# Patient Record
Sex: Female | Born: 1995 | Hispanic: Yes | Marital: Single | Smoking: Former smoker
Health system: Southern US, Community
[De-identification: ages and names within clinical notes are randomized; demographics above are authoritative.]

## PROBLEM LIST (undated history)

## (undated) DIAGNOSIS — F32A Depression, unspecified: Secondary | ICD-10-CM

---

## 2021-10-12 ENCOUNTER — Emergency Department (INDEPENDENT_AMBULATORY_CARE_PROVIDER_SITE_OTHER): Payer: 59

## 2021-10-12 ENCOUNTER — Other Ambulatory Visit: Payer: Self-pay

## 2021-10-12 ENCOUNTER — Emergency Department (INDEPENDENT_AMBULATORY_CARE_PROVIDER_SITE_OTHER)
Admission: EM | Admit: 2021-10-12 | Discharge: 2021-10-12 | Disposition: A | Discharge status: Home / Self Care | Payer: 59 | Source: Home / Self Care

## 2021-10-12 DIAGNOSIS — R109 Unspecified abdominal pain: Secondary | ICD-10-CM

## 2021-10-12 DIAGNOSIS — R1030 Lower abdominal pain, unspecified: Secondary | ICD-10-CM | POA: Diagnosis not present

## 2021-10-12 DIAGNOSIS — R110 Nausea: Secondary | ICD-10-CM

## 2021-10-12 HISTORY — DX: Depression, unspecified: F32.A

## 2021-10-12 MED ORDER — ONDANSETRON 8 MG PO TBDP: 8.0000 mg | ORAL_TABLET | Freq: Three times a day (TID) | ORAL | 0 refills | Status: AC | PRN

## 2021-10-12 NOTE — Discharge Instructions (Addendum)
Advised/inform patient today's imaging (flatplate of abdomen) was in normal limits without acute process.  Advised may use Zofran for nausea daily or as needed.  Advised patient if symptoms worsen and/or unresolved please follow-up with PCP or here for further evaluation (serological tests and scan of abdomen).

## 2021-10-12 NOTE — ED Triage Notes (Addendum)
Pt presents to Urgent Care with c/o lower abdominal pain x approx 1 week, especially when having bowel movements. Reports initially having diarrhea, but this has subsided. States she has had black stools x approx one week--has been taking Pepto-Bismol. Also reports having episodes of dizziness x 3 days.

## 2021-10-12 NOTE — ED Provider Notes (Signed)
[facility]    CSN: [id number] Arrival date & time: 10/12/21  1035      History   Chief Complaint Chief Complaint  Patient presents with   Abdominal Pain   Dizziness    HPI Chey[facility] Velazquez is a 26 y.o. female.   HPI 26 year old female presents with lower abdominal pain for 1 week especially when having bowel movements.  Reports was initially diarrhea in a house that subsided.  Reports black-colored stools for 1 week.  Past Medical History:  Diagnosis Date   Depression     There are no problems to display for this patient.   History reviewed. No pertinent surgical history.  OB History   No obstetric history on file.      Home Medications    Prior to Admission medications   Medication Sig Start Date End Date Taking? Authorizing Provider  bismuth subsalicylate (PEPTO BISMOL) 262 MG/15ML suspension Take 30 mLs by mouth every 6 (six) hours as needed.   Yes [provider]  ondansetron (ZOFRAN-ODT) 8 MG disintegrating tablet Take 1 tablet (8 mg total) by mouth every 8 (eight) hours as needed for nausea or vomiting. 10/12/21  Yes Eliezer Lofts, FNP    Family History Family History  Problem Relation Age of Onset   Hypertension Mother    Alzheimer's disease Father    Kidney failure Father    GI Bleed Father     Social History Social History   Tobacco Use   Smoking status: Former    Types: Cigarettes   Smokeless tobacco: Never  Vaping Use   Vaping Use: Former  Substance Use Topics   Alcohol use: Yes    Comment: occasionally   Drug use: Not Currently     Allergies   Patient has no known allergies.   Review of Systems Review of Systems  Gastrointestinal:  Positive for abdominal pain.       Dark-colored stools x1 week  All other systems reviewed and are negative.   Physical Exam Triage Vital Signs ED Triage Vitals  Enc Vitals Group     BP 10/12/21 1114 124/85     Pulse Rate 10/12/21 1114 80     Resp 10/12/21 1114 18      Temp 10/12/21 1114 98.9 F (37.2 C)     Temp Source 10/12/21 1114 Oral     SpO2 10/12/21 1114 97 %     Weight 10/12/21 1107 130 lb (59 kg)     Height 10/12/21 1107 5\' 1"  (1.549 m)     Head Circumference --      Peak Flow --      Pain Score 10/12/21 1107 3     Pain Loc --      Pain Edu? --      Excl. in [place]? --    No data found.  Updated Vital Signs BP 124/85 (BP Location: Right Arm)    Pulse 80    Temp 98.9 F (37.2 C) (Oral)    Resp 18    Ht 5\' 1"  (1.549 m)    Wt 130 lb (59 kg)    LMP 09/27/2021 (Approximate)    SpO2 97%    BMI 24.56 kg/m    Physical Exam Vitals and nursing note reviewed.  Constitutional:      General: She is not in acute distress.    Appearance: She is well-developed and normal weight. She is not ill-appearing.  HENT:     Head: Normocephalic and atraumatic.  Right Ear: Hearing, tympanic membrane, ear canal and external ear normal.     Left Ear: Hearing, tympanic membrane, ear canal and external ear normal.     Mouth/Throat:     Mouth: Mucous membranes are moist.     Pharynx: Oropharynx is clear. Uvula midline. No posterior oropharyngeal erythema or uvula swelling.     Tonsils: 3+ on the right. 3+ on the left.  Eyes:     Extraocular Movements: Extraocular movements intact.     Pupils: Pupils are equal, round, and reactive to light.  Cardiovascular:     Rate and Rhythm: Normal rate and regular rhythm.     Heart sounds: Normal heart sounds. No murmur heard.   No friction rub. No gallop.  Pulmonary:     Effort: Pulmonary effort is normal.     Breath sounds: Normal breath sounds.  Abdominal:     General: Abdomen is flat. Bowel sounds are absent.     Palpations: Abdomen is soft. There is no shifting dullness, fluid wave, hepatomegaly, splenomegaly, mass or pulsatile mass.     Tenderness: There is abdominal tenderness in the right lower quadrant and left lower quadrant. There is no right CVA tenderness, left CVA tenderness, guarding or rebound. Negative  signs include Murphy's sign, Rovsing's sign and McBurney's sign.  Skin:    General: Skin is warm and dry.  Neurological:     Mental Status: She is alert.     UC Treatments / Results  Labs (all labs ordered are listed, but only abnormal results are displayed) Labs Reviewed - No data to display  EKG   Radiology DG Abdomen 1 View  Result Date: 10/12/2021 CLINICAL DATA:  Abdominal pain. EXAM: ABDOMEN - 1 VIEW COMPARISON:  None. FINDINGS: The bowel gas pattern is unremarkable. Scattered air and stool throughout the colon but no significant stool burden. No findings for small bowel obstruction or free air. The soft tissue shadows are maintained. No worrisome calcifications. The bony structures are unremarkable. IMPRESSION: Unremarkable abdominal radiograph. Electronically Signed   By: Marijo Sanes M.D.   On: 10/12/2021 12:18    Procedures Procedures (including critical care time)  Medications Ordered in UC Medications - No data to display  Initial Impression / Assessment and Plan / UC Course  I have reviewed the triage vital signs and the nursing notes.  Pertinent labs & imaging results that were available during my care of the patient were reviewed by me and considered in my medical decision making (see chart for details).     MDM: 1.  Lower abdominal pain-KUB above was unremarkable; 2.  Nausea-Rx'd Zofran. Advised/inform patient today's imaging (flatplate of abdomen) was in normal limits without acute process.  Advised may use Zofran for nausea daily or as needed.  Advised patient if symptoms worsen and/or unresolved please follow-up with PCP or here for further evaluation (serological tests and scan of abdomen).  Patient discharged home, hemodynamically stable. Final Clinical Impressions(s) / UC Diagnoses   Final diagnoses:  Lower abdominal pain  Nausea     Discharge Instructions      Advised/inform patient today's imaging (flatplate of abdomen) was in normal limits without  acute process.  Advised may use Zofran for nausea daily or as needed.  Advised patient if symptoms worsen and/or unresolved please follow-up with PCP or here for further evaluation (serological tests and scan of abdomen).     ED Prescriptions     Medication Sig Dispense Auth. Provider   ondansetron (ZOFRAN-ODT) 8 MG disintegrating  tablet Take 1 tablet (8 mg total) by mouth every 8 (eight) hours as needed for nausea or vomiting. 24 tablet Eliezer Lofts, FNP      PDMP not reviewed this encounter.   Eliezer Lofts, [place] 10/12/21 1302

## 2021-11-30 DIAGNOSIS — Z23 Encounter for immunization: Secondary | ICD-10-CM | POA: Diagnosis not present

## 2023-02-12 IMAGING — DX DG ABDOMEN 1V
1 series · 1 of 1 positions shown · non-contrast
Comparison: None.

CLINICAL DATA: Abdominal pain.

EXAM:
ABDOMEN - 1 VIEW

[abdomen kub]
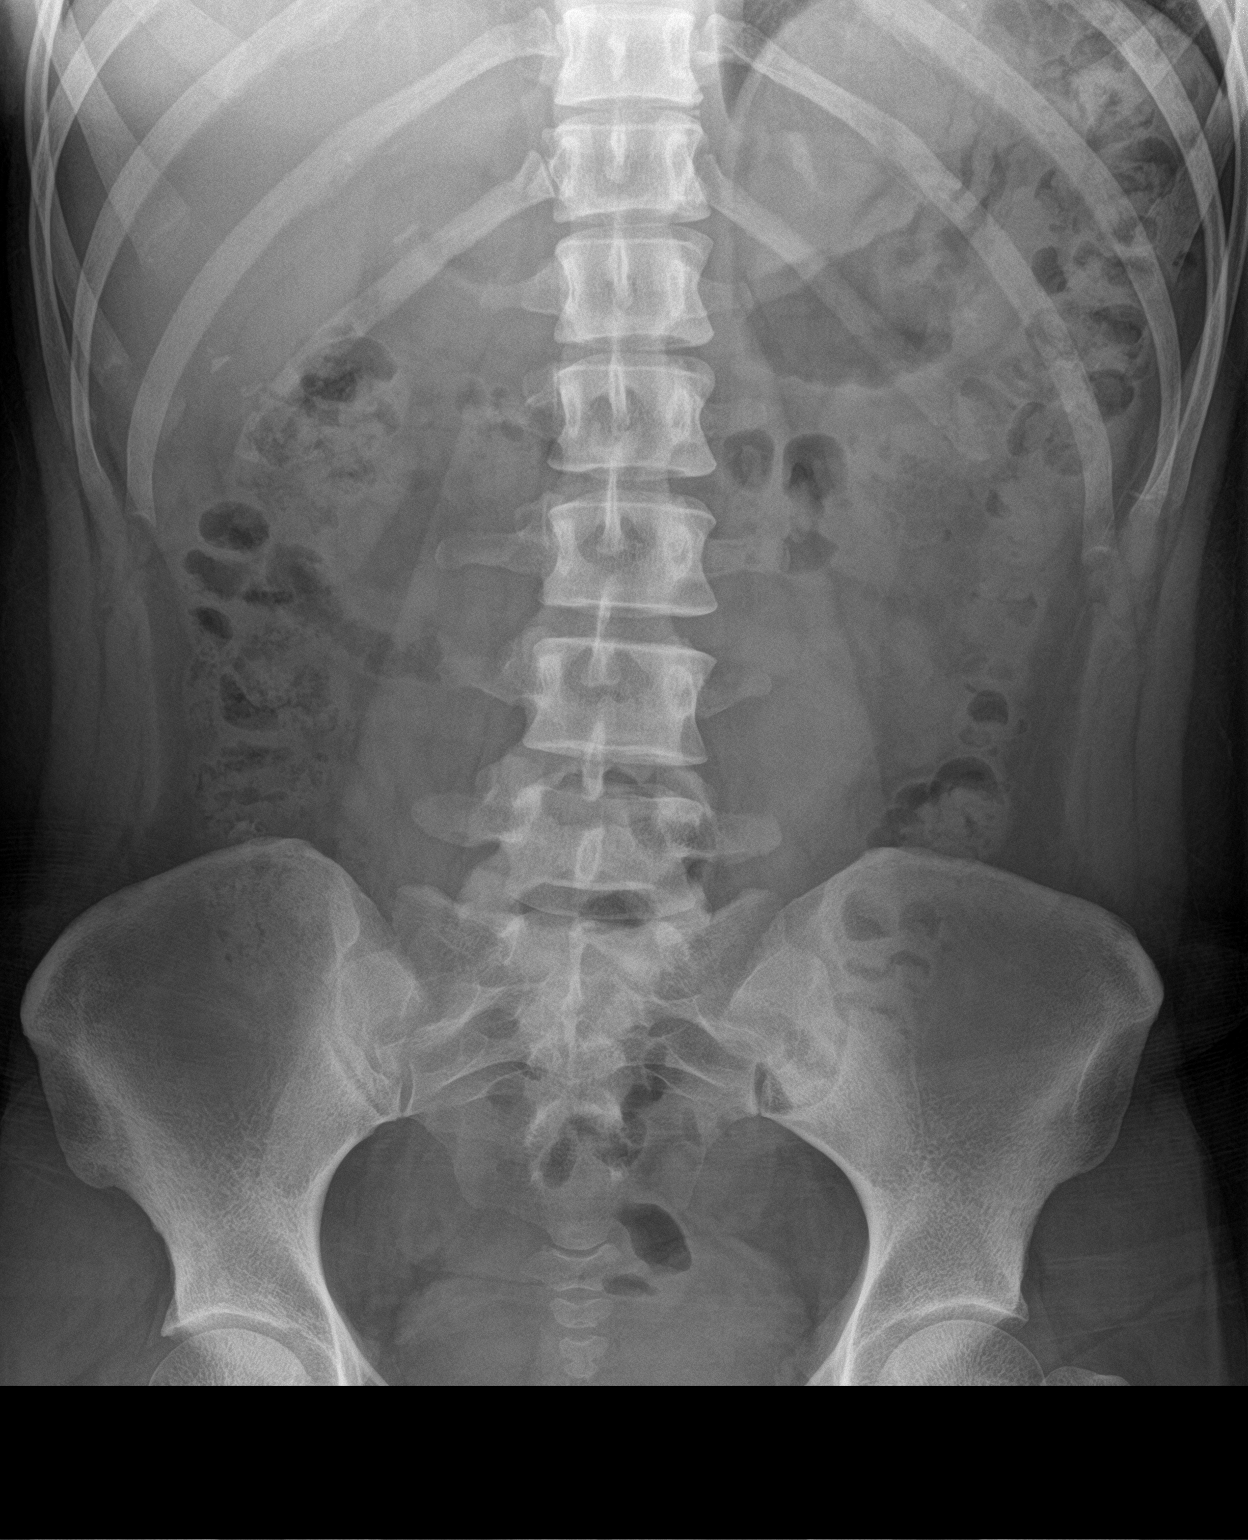

[1 of 1 positions shown; findings below may reference images not displayed]

FINDINGS: The bowel gas pattern is unremarkable. Scattered air and stool
throughout the colon but no significant stool burden. No findings
for small bowel obstruction or free air. The soft tissue shadows are
maintained. No worrisome calcifications. The bony structures are
unremarkable.
IMPRESSION: Unremarkable abdominal radiograph.
